# Patient Record
Sex: Female | Born: 1995 | Race: White | Hispanic: No | Marital: Single | Smoking: Never smoker
Health system: Southern US, Community
[De-identification: ages and names within clinical notes are randomized; demographics above are authoritative.]

## PROBLEM LIST (undated history)

## (undated) DIAGNOSIS — N83201 Unspecified ovarian cyst, right side: Secondary | ICD-10-CM

## (undated) HISTORY — DX: Unspecified ovarian cyst, right side: N83.201

---
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
## 2016-06-29 ENCOUNTER — Emergency Department (HOSPITAL_COMMUNITY): Payer: Commercial Managed Care - PPO

## 2016-06-29 ENCOUNTER — Encounter (HOSPITAL_COMMUNITY): Payer: Self-pay | Admitting: *Deleted

## 2016-06-29 ENCOUNTER — Emergency Department (HOSPITAL_COMMUNITY)
Admission: EM | Admit: 2016-06-29 | Discharge: 2016-06-29 | Disposition: A | Discharge status: Home / Self Care | Payer: Commercial Managed Care - PPO | Attending: Emergency Medicine | Admitting: Emergency Medicine

## 2016-06-29 DIAGNOSIS — R1031 Right lower quadrant pain: Secondary | ICD-10-CM | POA: Diagnosis present

## 2016-06-29 DIAGNOSIS — N83201 Unspecified ovarian cyst, right side: Secondary | ICD-10-CM | POA: Diagnosis not present

## 2016-06-29 LAB — COMPREHENSIVE METABOLIC PANEL
ALBUMIN: 4.4 g/dL (ref 3.5–5.0)
ALT: 16 U/L (ref 14–54)
AST: 19 U/L (ref 15–41)
Alkaline Phosphatase: 80 U/L (ref 38–126)
Anion gap: 7 (ref 5–15)
BILIRUBIN TOTAL: 0.5 mg/dL (ref 0.3–1.2)
BUN: 16 mg/dL (ref 6–20)
CHLORIDE: 105 mmol/L (ref 101–111)
CO2: 28 mmol/L (ref 22–32)
CREATININE: 0.72 mg/dL (ref 0.44–1.00)
Calcium: 9.7 mg/dL (ref 8.9–10.3)
GFR calc Af Amer: >60 mL/min (ref >60)
GFR calc non Af Amer: >60 mL/min (ref >60)
Glucose, Bld: 93 mg/dL (ref 65–99)
Potassium: 4.3 mmol/L (ref 3.5–5.1)
Sodium: 140 mmol/L (ref 135–145)
Total Protein: 7.8 g/dL (ref 6.5–8.1)

## 2016-06-29 LAB — LIPASE, BLOOD: Lipase: 26 U/L (ref 11–51)

## 2016-06-29 LAB — URINALYSIS, ROUTINE W REFLEX MICROSCOPIC
BILIRUBIN URINE: NEGATIVE
GLUCOSE, UA: NEGATIVE mg/dL
HGB URINE DIPSTICK: NEGATIVE
Ketones, ur: NEGATIVE mg/dL
Leukocytes, UA: NEGATIVE
Nitrite: NEGATIVE
PH: 7 (ref 5.0–8.0)
Protein, ur: NEGATIVE mg/dL
Specific Gravity, Urine: 1.008 (ref 1.005–1.030)

## 2016-06-29 LAB — CBC
HEMATOCRIT: 42.8 % (ref 36.0–46.0)
Hemoglobin: 14.1 g/dL (ref 12.0–15.0)
MCH: 29.1 pg (ref 26.0–34.0)
MCHC: 32.9 g/dL (ref 30.0–36.0)
MCV: 88.2 fL (ref 78.0–100.0)
Platelets: 239 10*3/uL (ref 150–400)
RBC: 4.85 MIL/uL (ref 3.87–5.11)
RDW: 12.8 % (ref 11.5–15.5)
WBC: 7.7 10*3/uL (ref 4.0–10.5)

## 2016-06-29 LAB — WET PREP, GENITAL
Clue Cells Wet Prep HPF POC: NONE SEEN
SPERM: NONE SEEN
Trich, Wet Prep: NONE SEEN
Yeast Wet Prep HPF POC: NONE SEEN

## 2016-06-29 LAB — POC URINE PREG, ED: Preg Test, Ur: NEGATIVE

## 2016-06-29 MED ORDER — KETOROLAC TROMETHAMINE 30 MG/ML IJ SOLN
30.0000 mg | Freq: Once | INTRAMUSCULAR | Status: AC
  Administered 2016-06-29: 30 mg via INTRAMUSCULAR
  Filled 2016-06-29: qty 1

## 2016-06-29 MED ORDER — NAPROXEN SODIUM 550 MG PO TABS: 550.0000 mg | ORAL_TABLET | Freq: Two times a day (BID) | ORAL | 0 refills | Status: DC

## 2016-06-29 NOTE — ED Notes (Signed)
Pt verbalized understanding discharge instructions and denies any further needs or questions at this time. VS stable, ambulatory and steady gait.   

## 2016-06-29 NOTE — ED Provider Notes (Signed)
[facility]-EMERGENCY DEPT Provider Note   CSN: [id number] Arrival date & time: 06/29/16  1620  By signing my name below, I, Rosario Adie, attest that this documentation has been prepared under the direction and in the presence of [facility], [place].  Electronically Signed: Rosario Adie, ED Scribe. 06/29/16. 5:06 PM.  History   Chief Complaint Chief Complaint  Patient presents with  . Flank Pain   The history is provided by the patient. No language interpreter was used.  Flank Pain  This is a new problem. The current episode started yesterday. The problem occurs constantly. The problem has been gradually worsening. Nothing aggravates the symptoms. Nothing relieves the symptoms. She has tried ASA and acetaminophen for the symptoms. The treatment provided no relief.    HPI Comments: Kelsey Patel is a 21 y.o. female who presents to the Emergency Department complaining of persistent, aching right flank pain beginning yesterday. She notes radiation of her pain into her right sided abdominal region. Pt has a h/o similar pain ~6-7 years ago related to an ovarian cyst confirmed via CT scan. No medical interventions were tried for this issue and her pain resolved on its own at that time. She has been taking Asprin and Tylenol at home without relief of her pain. Pt has not previously been pregnant or had a prior STI. She is not currently on BCP nor is she sexually active. Pt denies nausea, vomiting, diarrhea, fever, chills, frequency, urgency, hematuria, or any other associated symptoms. LNMP: ~2 weeks ago.   PCP: none  History reviewed. No pertinent past medical history.  There are no active problems to display for this patient.  History reviewed. No pertinent surgical history.  OB History    No data available     Home Medications    Prior to Admission medications   Medication Sig Start Date End Date Taking? Authorizing Provider  naproxen sodium (ANAPROX DS) 550 MG tablet Take 1  tablet (550 mg total) by mouth 2 (two) times daily with a meal. 06/29/16   Hope Orlene Och, NP   Family History No family history on file.  Social History Social History  Substance Use Topics  . Smoking status: Never Smoker  . Smokeless tobacco: Never Used  . Alcohol use No   Allergies   Patient has no known allergies.  Review of Systems Review of Systems  Constitutional: Negative for chills and fever.  Gastrointestinal: Negative for diarrhea, nausea and vomiting.  Genitourinary: Positive for flank pain. Negative for dysuria, frequency, hematuria and urgency.       Pain radiates to the right side of abdomen   Skin: Negative for wound.  All other systems reviewed and are negative.  Physical Exam Updated Vital Signs BP 135/80 (BP Location: Right Arm)   Pulse 80   Temp 99.3 F (37.4 C)   Resp 17   Ht 5\' 7"  (1.702 m)   Wt 85.5 kg   LMP 06/15/2016   SpO2 100%   BMI 29.52 kg/m   Physical Exam  Constitutional: She appears well-developed and well-nourished. No distress.  HENT:  Head: Normocephalic and atraumatic.  Right Ear: Tympanic membrane normal.  Left Ear: Tympanic membrane normal.  Mouth/Throat: Uvula is midline, oropharynx is clear and moist and mucous membranes are normal. No posterior oropharyngeal edema or posterior oropharyngeal erythema.  Eyes: EOM are normal. Pupils are equal, round, and reactive to light.  Neck: Normal range of motion.  Cardiovascular: Normal rate and regular rhythm.   Pulmonary/Chest: Effort  normal and breath sounds normal.  Abdominal: Soft. Bowel sounds are normal. There is tenderness (mild tenderness in the right upper and right lower quadrant). There is no rebound, no guarding and no CVA tenderness.  Genitourinary:  Genitourinary Comments: Chaperone present throughout entire exam. External genitalia without lesions. White d/c vaginal vault, no CMT, right adnexal tenderness that is mild, no mass palpated, uterus without palpable enlargement.    Musculoskeletal: Normal range of motion.  Neurological: She is alert.  Skin: Skin is warm and dry.  Psychiatric: She has a normal mood and affect. Her behavior is normal.  Nursing note and vitals reviewed.  ED Treatments / Results  DIAGNOSTIC STUDIES: Oxygen Saturation is 100% on RA, normal by my interpretation.   COORDINATION OF CARE: 5:06 PM-Discussed next steps with pt. Pt verbalized understanding and is agreeable with the plan.   Labs (all labs ordered are listed, but only abnormal results are displayed) Labs Reviewed  WET PREP, GENITAL - Abnormal; Notable for the following:       Result Value   WBC, Wet Prep HPF POC MANY (*)    All other components within normal limits  URINALYSIS, ROUTINE W REFLEX MICROSCOPIC - Abnormal; Notable for the following:    Color, Urine STRAW (*)    All other components within normal limits  LIPASE, BLOOD  COMPREHENSIVE METABOLIC PANEL  CBC  POC URINE PREG, ED  GC/CHLAMYDIA PROBE AMP ([place]) NOT AT [facility]  Radiology [place] Pelvis Complete  Result Date: 06/29/2016 CLINICAL DATA:  21 y/o  F; 1 day of right lower quadrant pain. EXAM: TRANSABDOMINAL ULTRASOUND OF PELVIS DOPPLER ULTRASOUND OF OVARIES TECHNIQUE: Transabdominal ultrasound examination of the pelvis was performed including evaluation of the uterus, ovaries, adnexal regions, and pelvic cul-de-sac. Color and duplex Doppler ultrasound was utilized to evaluate blood flow to the ovaries. COMPARISON:  06/14/2009 pelvic ultrasound. FINDINGS: Uterus Measurements: 7.2 x 2.3 x 4.7 cm. No fibroids or other mass visualized. Endometrium Thickness: 3.5 mm. No focal abnormality visualized. Right ovary Measurements: 8.5 x 5.3 x 5.8 cm. Cyst with simple features measuring 5.2 x 4.5 x 4.8 cm. Left ovary Measurements: 3.1 x 2.0 x 2.6 cm. Normal appearance/no adnexal mass. Pulsed Doppler evaluation demonstrates normal low-resistance arterial and venous waveforms in both ovaries. IMPRESSION: Right ovarian cysts  measuring up to 5.2 cm with benign features. A right ovarian cyst was present on the prior study measuring 5.8 cm. This is almost certainly benign, but follow up ultrasound is recommended in 1 year according to the Society of Radiologists in Ultrasound2010 Consensus Conference Statement (D Lenis NoonLevine et al. Management of Asymptomatic Ovarian and Other Adnexal Cysts Imaged at US: Society of Radiologists in Ultrasound Consensus Conference Statement 2010. Radiology 256 (Sept 2010): 943-954.). Electronically Signed   By: Mitzi HansenLance  Furusawa-Stratton M.D.   On: 06/29/2016 20:02   [place] Abdomen Limited  Result Date: 06/29/2016 CLINICAL DATA:  Abdominal pain EXAM: LIMITED ABDOMINAL ULTRASOUND TECHNIQUE: Wallace CullensGray scale imaging of the right lower quadrant was performed to evaluate for suspected appendicitis. Standard imaging planes and graded compression technique were utilized. COMPARISON:  None. FINDINGS: The appendix is not visualized. Ancillary findings: None. Factors affecting image quality: Body habitus IMPRESSION: The appendix is nonvisualized. No specific abnormalities are visualized in the right lower quadrant. Note: Non-visualization of appendix by US does not definitely exclude appendicitis. If there is sufficient clinical concern, consider abdomen pelvis CT with contrast for further evaluation. Electronically Signed   By: Jasmine PangKim  Fujinaga M.D.   On: 06/29/2016 19:57  [place] Art/ven Flow Abd Pelv Doppler  Result Date: 06/29/2016 CLINICAL DATA:  21 y/o  F; 1 day of right lower quadrant pain. EXAM: TRANSABDOMINAL ULTRASOUND OF PELVIS DOPPLER ULTRASOUND OF OVARIES TECHNIQUE: Transabdominal ultrasound examination of the pelvis was performed including evaluation of the uterus, ovaries, adnexal regions, and pelvic cul-de-sac. Color and duplex Doppler ultrasound was utilized to evaluate blood flow to the ovaries. COMPARISON:  06/14/2009 pelvic ultrasound. FINDINGS: Uterus Measurements: 7.2 x 2.3 x 4.7 cm. No fibroids or other mass  visualized. Endometrium Thickness: 3.5 mm. No focal abnormality visualized. Right ovary Measurements: 8.5 x 5.3 x 5.8 cm. Cyst with simple features measuring 5.2 x 4.5 x 4.8 cm. Left ovary Measurements: 3.1 x 2.0 x 2.6 cm. Normal appearance/no adnexal mass. Pulsed Doppler evaluation demonstrates normal low-resistance arterial and venous waveforms in both ovaries. IMPRESSION: Right ovarian cysts measuring up to 5.2 cm with benign features. A right ovarian cyst was present on the prior study measuring 5.8 cm. This is almost certainly benign, but follow up ultrasound is recommended in 1 year according to the Society of Radiologists in Ultrasound2010 Consensus Conference Statement (D Lenis Noon et al. Management of Asymptomatic Ovarian and Other Adnexal Cysts Imaged at [place]: Society of Radiologists in Ultrasound Consensus Conference Statement 2010. Radiology 256 (Sept 2010): 943-954.). Electronically Signed   By: Mitzi Hansen M.D.   On: 06/29/2016 20:02    Procedures Procedures   Medications Ordered in ED Medications  ketorolac (TORADOL) 30 MG/ML injection 30 mg (30 mg Intramuscular Given 06/29/16 2045)    Initial Impression / Assessment and Plan / ED Course  I have reviewed the triage vital signs and the nursing notes.  Pertinent labs & imaging results that were available during my care of the patient were reviewed by me and considered in my medical decision making (see chart for details).  Final Clinical Impressions(s) / ED Diagnoses  21 y.o. female with right side pain stable for d/c without acute abdomen. Normal labs, ultrasound shows a right ovarian cyst. Patient instructed to f/u with GYN. Will treat for pain. Return precautions given. Toradol 30 mg. IM prior to d/c.   Final diagnoses:  RLQ abdominal pain  Ovarian cyst, right   New Prescriptions Discharge Medication List as of 06/29/2016  8:35 PM    START taking these medications   Details  naproxen sodium (ANAPROX DS) 550 MG tablet  Take 1 tablet (550 mg total) by mouth 2 (two) times daily with a meal., Starting Wed 06/29/2016, Print       I personally performed the services described in this documentation, which was scribed in my presence. The recorded information has been reviewed and is accurate.     [address] [place], [place] 06/30/16 0128    Lyndal Pulley, MD 06/30/16 [phone]

## 2016-06-29 NOTE — ED Triage Notes (Signed)
The pt is c/o lt flank and lower abd pain since yesterday  No n v or diarrhea   She was sent here from an urgent care in [place]  lmp 2 weeks ago

## 2016-06-29 NOTE — Discharge Instructions (Signed)
Your ultrasound shows that you have a 5 cm cyst on your right ovary. Follow up with your primary care doctor or make an appointment in the [facility] for follow up. Take the medication as needed for pain.

## 2016-06-30 LAB — GC/CHLAMYDIA PROBE AMP (~~LOC~~) NOT AT ARMC
CHLAMYDIA, DNA PROBE: NEGATIVE
NEISSERIA GONORRHEA: NEGATIVE

## 2016-07-01 ENCOUNTER — Encounter (HOSPITAL_COMMUNITY): Payer: Self-pay | Admitting: Emergency Medicine

## 2016-07-01 ENCOUNTER — Emergency Department (HOSPITAL_COMMUNITY)
Admission: EM | Admit: 2016-07-01 | Discharge: 2016-07-01 | Disposition: A | Discharge status: Home / Self Care | Payer: Commercial Managed Care - PPO | Attending: Emergency Medicine | Admitting: Emergency Medicine

## 2016-07-01 ENCOUNTER — Emergency Department (HOSPITAL_COMMUNITY): Payer: Commercial Managed Care - PPO

## 2016-07-01 DIAGNOSIS — N83201 Unspecified ovarian cyst, right side: Secondary | ICD-10-CM | POA: Insufficient documentation

## 2016-07-01 DIAGNOSIS — R1031 Right lower quadrant pain: Secondary | ICD-10-CM | POA: Diagnosis present

## 2016-07-01 LAB — COMPREHENSIVE METABOLIC PANEL
ALBUMIN: 4.1 g/dL (ref 3.5–5.0)
ALK PHOS: 73 U/L (ref 38–126)
ALT: 20 U/L (ref 14–54)
AST: 23 U/L (ref 15–41)
Anion gap: 11 (ref 5–15)
BILIRUBIN TOTAL: 1 mg/dL (ref 0.3–1.2)
BUN: 13 mg/dL (ref 6–20)
CALCIUM: 9.4 mg/dL (ref 8.9–10.3)
CO2: 23 mmol/L (ref 22–32)
CREATININE: 0.81 mg/dL (ref 0.44–1.00)
Chloride: 98 mmol/L — ABNORMAL LOW (ref 101–111)
GFR calc Af Amer: >60 mL/min (ref >60)
GLUCOSE: 119 mg/dL — AB (ref 65–99)
Potassium: 3.9 mmol/L (ref 3.5–5.1)
Sodium: 132 mmol/L — ABNORMAL LOW (ref 135–145)
TOTAL PROTEIN: 7.5 g/dL (ref 6.5–8.1)

## 2016-07-01 LAB — CBC
HCT: 43.1 % (ref 36.0–46.0)
Hemoglobin: 14.3 g/dL (ref 12.0–15.0)
MCH: 28.7 pg (ref 26.0–34.0)
MCHC: 33.2 g/dL (ref 30.0–36.0)
MCV: 86.4 fL (ref 78.0–100.0)
PLATELETS: 241 10*3/uL (ref 150–400)
RBC: 4.99 MIL/uL (ref 3.87–5.11)
RDW: 12.3 % (ref 11.5–15.5)
WBC: 6.6 10*3/uL (ref 4.0–10.5)

## 2016-07-01 LAB — LIPASE, BLOOD: Lipase: 21 U/L (ref 11–51)

## 2016-07-01 LAB — I-STAT BETA HCG BLOOD, ED (MC, WL, AP ONLY): HCG, QUANTITATIVE: 5.4 m[IU]/mL — AB (ref <5)

## 2016-07-01 MED ORDER — KETOROLAC TROMETHAMINE 30 MG/ML IJ SOLN
30.0000 mg | Freq: Once | INTRAMUSCULAR | Status: AC
  Administered 2016-07-01: 30 mg via INTRAVENOUS
  Filled 2016-07-01: qty 1

## 2016-07-01 MED ORDER — ONDANSETRON 4 MG PO TBDP
4.0000 mg | ORAL_TABLET | Freq: Once | ORAL | Status: AC | PRN
  Administered 2016-07-01: 4 mg via ORAL

## 2016-07-01 MED ORDER — ONDANSETRON HCL 4 MG/2ML IJ SOLN
4.0000 mg | Freq: Once | INTRAMUSCULAR | Status: AC
  Administered 2016-07-01: 4 mg via INTRAVENOUS
  Filled 2016-07-01: qty 2

## 2016-07-01 MED ORDER — OXYCODONE-ACETAMINOPHEN 5-325 MG PO TABS
2.0000 | ORAL_TABLET | Freq: Once | ORAL | Status: AC
  Administered 2016-07-01: 1 via ORAL
  Filled 2016-07-01: qty 2

## 2016-07-01 MED ORDER — HYDROMORPHONE HCL 2 MG/ML IJ SOLN
1.0000 mg | Freq: Once | INTRAMUSCULAR | Status: AC
  Administered 2016-07-01: 1 mg via INTRAVENOUS
  Filled 2016-07-01: qty 1

## 2016-07-01 MED ORDER — SODIUM CHLORIDE 0.9 % IV BOLUS (SEPSIS)
1000.0000 mL | Freq: Once | INTRAVENOUS | Status: AC
  Administered 2016-07-01: 1000 mL via INTRAVENOUS

## 2016-07-01 MED ORDER — OXYCODONE-ACETAMINOPHEN 5-325 MG PO TABS: 1.0000 | ORAL_TABLET | ORAL | 0 refills | Status: DC | PRN

## 2016-07-01 MED ORDER — ONDANSETRON HCL 4 MG PO TABS: 4.0000 mg | ORAL_TABLET | Freq: Four times a day (QID) | ORAL | 0 refills | Status: DC

## 2016-07-01 MED ORDER — IOPAMIDOL (ISOVUE-300) INJECTION 61%
INTRAVENOUS | Status: AC
  Administered 2016-07-01: 100 mL
  Filled 2016-07-01: qty 100

## 2016-07-01 MED ORDER — ONDANSETRON 4 MG PO TBDP
ORAL_TABLET | ORAL | Status: AC
  Filled 2016-07-01: qty 1

## 2016-07-01 NOTE — ED Provider Notes (Signed)
[facility]-EMERGENCY DEPT Provider Note   CSN: [id number] Arrival date & time: 07/01/16  0110  By signing my name below, I, Cynda AcresHailei Fulton, attest that this documentation has been prepared under the direction and in the presence of Gilda Creasehristopher J Frank Pilger, MD. Electronically Signed: Cynda AcresHailei Fulton, Scribe. 07/01/16. 2:27 AM.  History   Chief Complaint Chief Complaint  Patient presents with  . Abdominal Pain    RLQ    HPI Comments: Kelsey Patel is a 21 y.o. female with no pertinent medical history, who presents to the Emergency Department complaining of sudden-onset, gradually worsening RLQ abdominal pain that began a few days ago. Patient states her pain has been constant. Patient was here 2 days ago, in which an ultrasound was done revealing a right ovarian cyst, she was prescribed naproxen. Patient was advised to return to the emergency department if her symptoms worsened. Father states the last time the patient had this problem was 8 years ago. No pain relief with prescribed pain medication. Patient denies any shortness of breath, nausea, vomiting, diarrhea, or fever.   The history is provided by the patient.    History reviewed. No pertinent past medical history.  There are no active problems to display for this patient.   History reviewed. No pertinent surgical history.  OB History    No data available       Home Medications    Prior to Admission medications   Medication Sig Start Date End Date Taking? Authorizing Provider  naproxen sodium (ANAPROX DS) 550 MG tablet Take 1 tablet (550 mg total) by mouth 2 (two) times daily with a meal. 06/29/16  Yes Hope Orlene OchM Neese, NP  ondansetron (ZOFRAN) 4 MG tablet Take 1 tablet (4 mg total) by mouth every 6 (six) hours. 07/01/16   Gilda Creasehristopher J Burlie Cajamarca, MD  oxyCODONE-acetaminophen (PERCOCET) 5-325 MG tablet Take 1-2 tablets by mouth every 4 (four) hours as needed for severe pain. 07/01/16   Gilda Creasehristopher J Oddie Bottger, MD    Family History History  reviewed. No pertinent family history.  Social History Social History  Substance Use Topics  . Smoking status: Never Smoker  . Smokeless tobacco: Never Used  . Alcohol use No     Allergies   Patient has no known allergies.   Review of Systems Review of Systems  Constitutional: Negative for fever.  Respiratory: Negative for shortness of breath.   Gastrointestinal: Positive for abdominal pain. Negative for diarrhea, nausea and vomiting.  All other systems reviewed and are negative.    Physical Exam Updated Vital Signs BP 123/72   Pulse 82   Temp 97.8 F (36.6 C) (Oral)   Resp 16   LMP 06/15/2016   SpO2 98%   Physical Exam  Constitutional: She is oriented to person, place, and time. She appears well-developed and well-nourished. No distress.  HENT:  Head: Normocephalic and atraumatic.  Right Ear: Hearing normal.  Left Ear: Hearing normal.  Nose: Nose normal.  Mouth/Throat: Oropharynx is clear and moist and mucous membranes are normal.  Eyes: Conjunctivae and EOM are normal. Pupils are equal, round, and reactive to light.  Neck: Normal range of motion. Neck supple.  Cardiovascular: Regular rhythm, S1 normal and S2 normal.  Exam reveals no gallop and no friction rub.   No murmur heard. Pulmonary/Chest: Effort normal and breath sounds normal. No respiratory distress. She exhibits no tenderness.  Abdominal: Soft. Normal appearance and bowel sounds are normal. There is no hepatosplenomegaly. There is tenderness. There is no rebound, no guarding, no  tenderness at McBurney's point and negative Murphy's sign.  Diffuse right sided abdominal tenderness.   Musculoskeletal: Normal range of motion.  Neurological: She is alert and oriented to person, place, and time. She has normal strength. No cranial nerve deficit or sensory deficit. Coordination normal. GCS eye subscore is 4. GCS verbal subscore is 5. GCS motor subscore is 6.  Skin: Skin is warm, dry and intact. No rash noted. No  cyanosis.  Psychiatric: She has a normal mood and affect. Her speech is normal and behavior is normal. Thought content normal.  Nursing note and vitals reviewed.    ED Treatments / Results  DIAGNOSTIC STUDIES: Oxygen Saturation is 100% on RA, normal by my interpretation.    COORDINATION OF CARE: 2:27 AM Discussed treatment plan with pt at bedside and pt agreed to plan.  Labs (all labs ordered are listed, but only abnormal results are displayed) Labs Reviewed  COMPREHENSIVE METABOLIC PANEL - Abnormal; Notable for the following:       Result Value   Sodium 132 (*)    Chloride 98 (*)    Glucose, Bld 119 (*)    All other components within normal limits  I-STAT BETA HCG BLOOD, ED ([facility], [facility], [facility] ONLY) - Abnormal; Notable for the following:    I-stat hCG, quantitative 5.4 (*)    All other components within normal limits  LIPASE, BLOOD  CBC    EKG  EKG Interpretation None       Radiology US Pelvis Complete  Result Date: 06/29/2016 CLINICAL DATA:  21 y/o  F; 1 day of right lower quadrant pain. EXAM: TRANSABDOMINAL ULTRASOUND OF PELVIS DOPPLER ULTRASOUND OF OVARIES TECHNIQUE: Transabdominal ultrasound examination of the pelvis was performed including evaluation of the uterus, ovaries, adnexal regions, and pelvic cul-de-sac. Color and duplex Doppler ultrasound was utilized to evaluate blood flow to the ovaries. COMPARISON:  06/14/2009 pelvic ultrasound. FINDINGS: Uterus Measurements: 7.2 x 2.3 x 4.7 cm. No fibroids or other mass visualized. Endometrium Thickness: 3.5 mm. No focal abnormality visualized. Right ovary Measurements: 8.5 x 5.3 x 5.8 cm. Cyst with simple features measuring 5.2 x 4.5 x 4.8 cm. Left ovary Measurements: 3.1 x 2.0 x 2.6 cm. Normal appearance/no adnexal mass. Pulsed Doppler evaluation demonstrates normal low-resistance arterial and venous waveforms in both ovaries. IMPRESSION: Right ovarian cysts measuring up to 5.2 cm with benign features. A right ovarian cyst was  present on the prior study measuring 5.8 cm. This is almost certainly benign, but follow up ultrasound is recommended in 1 year according to the Society of Radiologists in Ultrasound2010 Consensus Conference Statement (D Lenis Noon et al. Management of Asymptomatic Ovarian and Other Adnexal Cysts Imaged at [place]: Society of Radiologists in Ultrasound Consensus Conference Statement 2010. Radiology 256 (Sept 2010): 943-954.). Electronically Signed   By: Mitzi Hansen M.D.   On: 06/29/2016 20:02   Ct Abdomen Pelvis W Contrast  Result Date: 07/01/2016 CLINICAL DATA:  Initial evaluation for acute severe right lower quadrant pain. Evaluate for possible appendicitis. EXAM: CT ABDOMEN AND PELVIS WITH CONTRAST TECHNIQUE: Multidetector CT imaging of the abdomen and pelvis was performed using the standard protocol following bolus administration of intravenous contrast. CONTRAST:  ISOVUE-300 IOPAMIDOL (ISOVUE-300) INJECTION 61% COMPARISON:  Prior pelvic ultrasound from 06/29/2016. FINDINGS: Lower chest: Visualized lung bases are clear. Hepatobiliary: The liver demonstrates a normal contrast enhanced appearance. Gallbladder within normal limits. No biliary dilatation. Pancreas: Pancreas within normal limits. Spleen: Spleen within normal limits. Adrenals/Urinary Tract: Adrenal glands are normal. Kidneys equal size with  symmetric enhancement. No nephrolithiasis, hydronephrosis, or focal enhancing renal mass. No hydroureter. Bladder within normal limits. Stomach/Bowel: Stomach within normal limits. No evidence for bowel obstruction. Appendix well visualized within the right lower quadrant, retrocecal in location. Appendix within normal limits for caliber without associated inflammatory changes to suggest acute appendicitis. No abnormal wall thickening, mucosal enhancement, or inflammatory fat stranding seen about the bowels. Vascular/Lymphatic: Normal intravascular enhancement seen throughout the intra-abdominal aorta  and its branch vessels. No pathologically enlarged intra-abdominal or pelvic lymph nodes identified. Reproductive: The uterus within normal limits. Left ovary unremarkable. Large cysts measuring approximately 5.6 x 5.7 x 5.7 cm seen within the right pelvic cul-de-sac, likely similar to previous this is largely simple in appearance, with near. The adjacent right ovary is not well delineated. Small volume free fluid within the adjacent pelvic cul-de-sac. Other: No free intraperitoneal air. Musculoskeletal: No acute osseous abnormality. No worrisome lytic or blastic osseous lesions. IMPRESSION: 1. Normal appendix.  No findings to suggest acute appendicitis. 2. Prominent right adnexal cyst measuring up to 5.7 cm on today's study, likely similar as compared to recent ultrasound from 06/29/2016. Small volume free fluid is evident within the pelvic cul-de-sac, which may reflect interval cyst rupture, as no fluid was visualized on prior ultrasound. If ovarian torsion is a concern, a repeat Doppler ultrasound is recommended. Otherwise, follow-up recommendations as detailed on prior exam. 3. No other acute intra-abdominal or pelvic process identified. Electronically Signed   By: Rise Mu M.D.   On: 07/01/2016 02:19   US Abdomen Limited  Result Date: 06/29/2016 CLINICAL DATA:  Abdominal pain EXAM: LIMITED ABDOMINAL ULTRASOUND TECHNIQUE: Wallace Cullens scale imaging of the right lower quadrant was performed to evaluate for suspected appendicitis. Standard imaging planes and graded compression technique were utilized. COMPARISON:  None. FINDINGS: The appendix is not visualized. Ancillary findings: None. Factors affecting image quality: Body habitus IMPRESSION: The appendix is nonvisualized. No specific abnormalities are visualized in the right lower quadrant. Note: Non-visualization of appendix by [place] does not definitely exclude appendicitis. If there is sufficient clinical concern, consider abdomen pelvis CT with contrast  for further evaluation. Electronically Signed   By: Jasmine Pang M.D.   On: 06/29/2016 19:57   [place] Art/ven Flow Abd Pelv Doppler  Result Date: 06/29/2016 CLINICAL DATA:  21 y/o  F; 1 day of right lower quadrant pain. EXAM: TRANSABDOMINAL ULTRASOUND OF PELVIS DOPPLER ULTRASOUND OF OVARIES TECHNIQUE: Transabdominal ultrasound examination of the pelvis was performed including evaluation of the uterus, ovaries, adnexal regions, and pelvic cul-de-sac. Color and duplex Doppler ultrasound was utilized to evaluate blood flow to the ovaries. COMPARISON:  06/14/2009 pelvic ultrasound. FINDINGS: Uterus Measurements: 7.2 x 2.3 x 4.7 cm. No fibroids or other mass visualized. Endometrium Thickness: 3.5 mm. No focal abnormality visualized. Right ovary Measurements: 8.5 x 5.3 x 5.8 cm. Cyst with simple features measuring 5.2 x 4.5 x 4.8 cm. Left ovary Measurements: 3.1 x 2.0 x 2.6 cm. Normal appearance/no adnexal mass. Pulsed Doppler evaluation demonstrates normal low-resistance arterial and venous waveforms in both ovaries. IMPRESSION: Right ovarian cysts measuring up to 5.2 cm with benign features. A right ovarian cyst was present on the prior study measuring 5.8 cm. This is almost certainly benign, but follow up ultrasound is recommended in 1 year according to the Society of Radiologists in Ultrasound2010 Consensus Conference Statement (D Lenis Noon et al. Management of Asymptomatic Ovarian and Other Adnexal Cysts Imaged at [place]: Society of Radiologists in Ultrasound Consensus Conference Statement 2010. Radiology 256 (Sept 2010): 943-954.). Electronically Signed  By: Mitzi Hansen M.D.   On: 06/29/2016 20:02    Procedures Procedures (including critical care time)  Medications Ordered in ED Medications  ondansetron (ZOFRAN-ODT) 4 MG disintegrating tablet (not administered)  oxyCODONE-acetaminophen (PERCOCET/ROXICET) 5-325 MG per tablet 2 tablet (not administered)  ondansetron (ZOFRAN-ODT) disintegrating tablet  4 mg (4 mg Oral Given 07/01/16 0122)  iopamidol (ISOVUE-300) 61 % injection (100 mLs  Contrast Given 07/01/16 0148)  sodium chloride 0.9 % bolus 1,000 mL (1,000 mLs Intravenous New Bag/Given 07/01/16 0257)  HYDROmorphone (DILAUDID) injection 1 mg (1 mg Intravenous Given 07/01/16 0236)  ondansetron (ZOFRAN) injection 4 mg (4 mg Intravenous Given 07/01/16 0236)  ketorolac (TORADOL) 30 MG/ML injection 30 mg (30 mg Intravenous Given 07/01/16 0415)     Initial Impression / Assessment and Plan / ED Course  I have reviewed the triage vital signs and the nursing notes.  Pertinent labs & imaging results that were available during my care of the patient were reviewed by me and considered in my medical decision making (see chart for details).     Patient presents to the emergency department for evaluation of persistent and worsening right lower quadrant pain. Patient was seen one day ago with similar symptoms. Pelvic ultrasound including Doppler revealed evidence of large adnexal cyst without complications. Patient has been taking the Naprosyn, but tonight the pain worsened. Patient was in a great deal of distress at arrival. Pain significantly improved with treatment here in the ER.  Plan CT scan to rule out other causes of pain, such as appendicitis. CT scan shows a normal appendix. There is some pelvic fluid, perhaps consistent with rupture of cyst. No evidence of hemorrhage or hemodynamic instability. Patient appropriate for discharge, increased analgesia and follow-up with gynecology.  Final Clinical Impressions(s) / ED Diagnoses   Final diagnoses:  Cyst of right ovary    New Prescriptions New Prescriptions   ONDANSETRON (ZOFRAN) 4 MG TABLET    Take 1 tablet (4 mg total) by mouth every 6 (six) hours.   OXYCODONE-ACETAMINOPHEN (PERCOCET) 5-325 MG TABLET    Take 1-2 tablets by mouth every 4 (four) hours as needed for severe pain.   I personally performed the services described in this documentation, which  was scribed in my presence. The recorded information has been reviewed and is accurate.     Gilda Crease, MD 07/01/16 [phone]

## 2016-07-01 NOTE — ED Triage Notes (Signed)
Pt presents with worsening RLQ abd pain; seen here for same couple days ago; pt was told to return for worsening symptoms- concerned for appendicitis

## 2016-08-08 ENCOUNTER — Encounter: Payer: Self-pay | Admitting: Obstetrics and Gynecology

## 2016-08-08 ENCOUNTER — Ambulatory Visit (INDEPENDENT_AMBULATORY_CARE_PROVIDER_SITE_OTHER): Payer: Commercial Managed Care - PPO | Admitting: Obstetrics and Gynecology

## 2016-08-08 DIAGNOSIS — N83201 Unspecified ovarian cyst, right side: Secondary | ICD-10-CM | POA: Diagnosis not present

## 2016-08-08 DIAGNOSIS — N946 Dysmenorrhea, unspecified: Secondary | ICD-10-CM | POA: Diagnosis not present

## 2016-08-08 DIAGNOSIS — N83209 Unspecified ovarian cyst, unspecified side: Secondary | ICD-10-CM | POA: Insufficient documentation

## 2016-08-08 MED ORDER — NORETHIN-ETH ESTRAD-FE BIPHAS 1 MG-10 MCG / 10 MCG PO TABS: 1.0000 | ORAL_TABLET | Freq: Every day | ORAL | 11 refills | Status: DC

## 2016-08-08 NOTE — Progress Notes (Signed)
[place] scheduled for April 20th @ 1400.  Pt notified.   Ms Quebedeaux is a 21 yo nulligravid female who presents to clinic for f/o of right ovarian cyst.  Pt seen in ER first part of March for pain and Dx with ovarian cyst. See ER visit for more information.  She reports that her pain has resolved.  She reports monthly cycles, cramps, last 6-7 days and some PMS Sx.  Pt not sexual active nor has she been.  PE AF VSS Lungs clear Heart RRR   A/P  Ovarian Cyst     Dysmenorrhea  Ovarian cysts reviewed with pt. Will schedule GYN U/S for f/u. Discussed OCP's for dysmenorrhea. Pt desired. U/R/B of OCP's reviewed. Will start with next cycle. F/U in 3 months of PRN

## 2016-08-08 NOTE — Patient Instructions (Signed)
Dysmenorrhea Menstrual cramps (dysmenorrhea) are caused by the muscles of the uterus tightening (contracting) during a menstrual period. For some women, this discomfort is merely bothersome. For others, dysmenorrhea can be severe enough to interfere with everyday activities for a few days each month. Primary dysmenorrhea is menstrual cramps that last a couple of days when you start having menstrual periods or soon after. This often begins after a teenager starts having her period. As a woman gets older or has a baby, the cramps will usually lessen or disappear. Secondary dysmenorrhea begins later in life, lasts longer, and the pain may be stronger than primary dysmenorrhea. The pain may start before the period and last a few days after the period. What are the causes? Dysmenorrhea is usually caused by an underlying problem, such as:  The tissue lining the uterus grows outside of the uterus in other areas of the body (endometriosis).  The endometrial tissue, which normally lines the uterus, is found in or grows into the muscular walls of the uterus (adenomyosis).  The pelvic blood vessels are engorged with blood just before the menstrual period (pelvic congestive syndrome).  Overgrowth of cells (polyps) in the lining of the uterus or cervix.  Falling down of the uterus (prolapse) because of loose or stretched ligaments.  Depression.  Bladder problems, infection, or inflammation.  Problems with the intestine, a tumor, or irritable bowel syndrome.  Cancer of the female organs or bladder.  A severely tipped uterus.  A very tight opening or closed cervix.  Noncancerous tumors of the uterus (fibroids).  Pelvic inflammatory disease (PID).  Pelvic scarring (adhesions) from a previous surgery.  Ovarian cyst.  An intrauterine device (IUD) used for birth control. What increases the risk? You may be at greater risk of dysmenorrhea if:  You are younger than age 30.  You started puberty  early.  You have irregular or heavy bleeding.  You have never given birth.  You have a family history of this problem.  You are a smoker. What are the signs or symptoms?  Cramping or throbbing pain in your lower abdomen.  Headaches.  Lower back pain.  Nausea or vomiting.  Diarrhea.  Sweating or dizziness.  Loose stools. How is this diagnosed? A diagnosis is based on your history, symptoms, physical exam, diagnostic tests, or procedures. Diagnostic tests or procedures may include:  Blood tests.  Ultrasonography.  An examination of the lining of the uterus (dilation and curettage, D&C).  An examination inside your abdomen or pelvis with a scope (laparoscopy).  X-rays.  CT scan.  MRI.  An examination inside the bladder with a scope (cystoscopy).  An examination inside the intestine or stomach with a scope (colonoscopy, gastroscopy). How is this treated? Treatment depends on the cause of the dysmenorrhea. Treatment may include:  Pain medicine prescribed by your health care provider.  Birth control pills or an IUD with progesterone hormone in it.  Hormone replacement therapy.  Nonsteroidal anti-inflammatory drugs (NSAIDs). These may help stop the production of prostaglandins.  Surgery to remove adhesions, endometriosis, ovarian cyst, or fibroids.  Removal of the uterus (hysterectomy).  Progesterone shots to stop the menstrual period.  Cutting the nerves on the sacrum that go to the female organs (presacral neurectomy).  Electric current to the sacral nerves (sacral nerve stimulation).  Antidepressant medicine.  Psychiatric therapy, counseling, or group therapy.  Exercise and physical therapy.  Meditation and yoga therapy.  Acupuncture. Follow these instructions at home:  Only take over-the-counter or prescription medicines as directed   by your health care provider.  Place a heating pad or hot water bottle on your lower back or abdomen. Do not  sleep with the heating pad.  Use aerobic exercises, walking, swimming, biking, and other exercises to help lessen the cramping.  Massage to the lower back or abdomen may help.  Stop smoking.  Avoid alcohol and caffeine. Contact a health care provider if:  Your pain does not get better with medicine.  You have pain with sexual intercourse.  Your pain increases and is not controlled with medicines.  You have abnormal vaginal bleeding with your period.  You develop nausea or vomiting with your period that is not controlled with medicine. Get help right away if: You pass out. This information is not intended to replace advice given to you by your health care provider. Make sure you discuss any questions you have with your health care provider. Document Released: 04/11/2005 Document Revised: 09/17/2015 Document Reviewed: 09/27/2012 Elsevier Interactive Patient Education  2017 [employer] Ovarian Cyst An ovarian cyst is a fluid-filled sac on an ovary. The ovaries are organs that make eggs in women. Most ovarian cysts go away on their own and are not cancerous (are benign). Some cysts need treatment. Follow these instructions at home:  Take over-the-counter and prescription medicines only as told by your doctor.  Do not drive or use heavy machinery while taking prescription pain medicine.  Get pelvic exams and Pap tests as often as told by your doctor.  Return to your normal activities as told by your doctor. Ask your doctor what activities are safe for you.  Do not use any products that contain nicotine or tobacco, such as cigarettes and e-cigarettes. If you need help quitting, ask your doctor.  Keep all follow-up visits as told by your doctor. This is important. Contact a doctor if:  Your periods are:  Late.  Irregular.  Painful.  Your periods stop.  You have pelvic pain that does not go away.  You have pressure on your bladder.  You have trouble making your  bladder empty when you pee (urinate).  You have pain during sex.  You have any of the following in your belly (abdomen):  A feeling of fullness.  Pressure.  Discomfort.  Pain that does not go away.  Swelling.  You feel sick most of the time.  You have trouble pooping (have constipation).  You are not as hungry as usual (you lose your appetite).  You get very bad acne.  You start to have more hair on your body and face.  You are gaining weight or losing weight without changing your exercise and eating habits.  You think you may be pregnant. Get help right away if:  You have belly pain that is very bad or gets worse.  You cannot eat or drink without throwing up (vomiting).  You suddenly get a fever.  Your period is a lot heavier than usual. This information is not intended to replace advice given to you by your health care provider. Make sure you discuss any questions you have with your health care provider. Document Released: 09/28/2007 Document Revised: 10/30/2015 Document Reviewed: 09/13/2015 Elsevier Interactive Patient Education  2017 [employer].

## 2016-08-12 ENCOUNTER — Ambulatory Visit (HOSPITAL_COMMUNITY)
Admission: RE | Admit: 2016-08-12 | Discharge: 2016-08-12 | Disposition: A | Discharge status: Home / Self Care | Payer: Commercial Managed Care - PPO | Source: Ambulatory Visit | Attending: Obstetrics and Gynecology | Admitting: Obstetrics and Gynecology

## 2016-08-12 DIAGNOSIS — N83201 Unspecified ovarian cyst, right side: Secondary | ICD-10-CM

## 2016-08-16 ENCOUNTER — Telehealth: Payer: Self-pay | Admitting: *Deleted

## 2016-08-16 DIAGNOSIS — N83201 Unspecified ovarian cyst, right side: Secondary | ICD-10-CM

## 2016-08-16 NOTE — Telephone Encounter (Signed)
Called pt and spoke with her father.  He stated that Linday is at work and will be home after 4pm. I stated that I will call back and there is no emergency. Per Dr. Alysia Penna pt needs to be informed that her recent US showed that cysts are still present on her ovaries and they appear benign. She should continue OCP's and have follow up US in 2 months. The [place] must be performed one week after her cycle (Proper timing is extremely important). Order for [place] has been placed.

## 2016-08-23 NOTE — Telephone Encounter (Signed)
Called patient home phone and her father answered stating the patient was not there. The father gave me the patient cell phone number [phone] and said that the patient can be reached at that number after 12 noon. I will attempt to call the patient back.

## 2016-08-23 NOTE — Telephone Encounter (Signed)
Called pt and informed her of [place] results and plan of care recommendations per Dr. Alysia Penna.  Pt was instructed to call office on [place] one of menstrual cycle in about 2 months. We will schedule the [place] to be performed 1 week after her cycle begins.  Pt voiced understanding and had no questions.

## 2016-08-24 ENCOUNTER — Telehealth: Payer: Self-pay | Admitting: General Practice

## 2016-08-24 NOTE — Telephone Encounter (Signed)
Patient is calling to request ultrasound results. States she misses our calls because she works during the day. States the best time to be reached is 03-1229 and after 4.

## 2016-12-06 ENCOUNTER — Ambulatory Visit (HOSPITAL_COMMUNITY)
Admission: RE | Admit: 2016-12-06 | Discharge: 2016-12-06 | Disposition: A | Discharge status: Home / Self Care | Payer: Commercial Managed Care - PPO | Source: Ambulatory Visit | Attending: Obstetrics and Gynecology | Admitting: Obstetrics and Gynecology

## 2016-12-06 DIAGNOSIS — N83201 Unspecified ovarian cyst, right side: Secondary | ICD-10-CM

## 2016-12-06 DIAGNOSIS — Z87898 Personal history of other specified conditions: Secondary | ICD-10-CM | POA: Insufficient documentation

## 2016-12-06 DIAGNOSIS — Z09 Encounter for follow-up examination after completed treatment for conditions other than malignant neoplasm: Secondary | ICD-10-CM | POA: Insufficient documentation

## 2016-12-14 ENCOUNTER — Telehealth: Payer: Self-pay | Admitting: General Practice

## 2016-12-14 NOTE — Telephone Encounter (Signed)
-----   Message from Hermina Staggers, MD sent at 12/12/2016  9:30 AM EDT ----- Please let pt know that the ovarian cyst has resolved. Thanks Casimiro Needle

## 2016-12-14 NOTE — Telephone Encounter (Signed)
Patient called and left message stating she missed a call from [place] and would like her ultrasound results.

## 2016-12-14 NOTE — Telephone Encounter (Signed)
Called patient, no answer- unable to leave message due to no voicemail set up.  

## 2016-12-16 NOTE — Telephone Encounter (Signed)
Patient called again, wants results of u/s.

## 2016-12-16 NOTE — Telephone Encounter (Signed)
Called patient with u/s results. Patient voiced understanding and had no further questions.

## 2017-06-09 ENCOUNTER — Other Ambulatory Visit: Payer: Self-pay | Admitting: Obstetrics and Gynecology

## 2017-06-09 ENCOUNTER — Other Ambulatory Visit: Payer: Self-pay | Admitting: Obstetrics

## 2017-06-09 DIAGNOSIS — N946 Dysmenorrhea, unspecified: Secondary | ICD-10-CM

## 2017-06-09 DIAGNOSIS — Z3041 Encounter for surveillance of contraceptive pills: Secondary | ICD-10-CM

## 2017-06-09 MED ORDER — NORETHIN ACE-ETH ESTRAD-FE 1-20 MG-MCG(24) PO TABS: 1.0000 | ORAL_TABLET | Freq: Every day | ORAL | 4 refills | Status: DC

## 2017-06-13 ENCOUNTER — Other Ambulatory Visit: Payer: Self-pay

## 2017-06-13 DIAGNOSIS — Z3041 Encounter for surveillance of contraceptive pills: Secondary | ICD-10-CM

## 2017-06-13 MED ORDER — NORETHIN ACE-ETH ESTRAD-FE 1-20 MG-MCG(24) PO TABS: 1.0000 | ORAL_TABLET | Freq: Every day | ORAL | 4 refills | Status: DC

## 2018-08-17 ENCOUNTER — Other Ambulatory Visit: Payer: Self-pay | Admitting: Obstetrics

## 2018-08-17 DIAGNOSIS — Z3041 Encounter for surveillance of contraceptive pills: Secondary | ICD-10-CM

## 2018-12-01 IMAGING — CT CT ABD-PELV W/ CM
2 of 4 series · 16 of 46 positions shown, 18 images · IV contrast (Omni 300)
Comparison: Prior pelvic ultrasound from 06/29/2016.

CLINICAL DATA: Initial evaluation for acute severe right lower
quadrant pain. Evaluate for possible appendicitis.

EXAM:
CT ABDOMEN AND PELVIS WITH CONTRAST
TECHNIQUE: Multidetector CT imaging of the abdomen and pelvis was performed
using the standard protocol following bolus administration of
intravenous contrast.
CONTRAST:  100mL 0U1TAT-2FF IOPAMIDOL (0U1TAT-2FF) INJECTION 61%

[Series 3: a/p w/ 5mm · axial · 0.55mm/px · z∈[+902,+1277]mm · 13 of 83 slices shown, 15 images]
[im 4/83  soft-tissue]
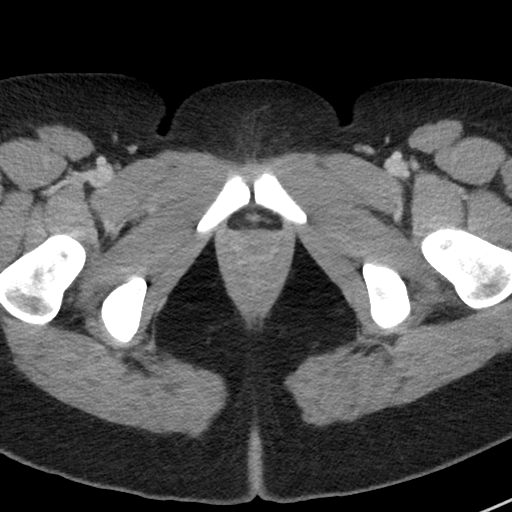
[im 4/83  bone]
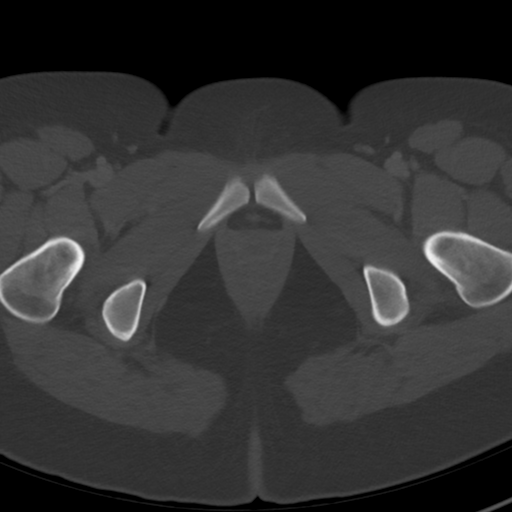
[im 10/83  soft-tissue]
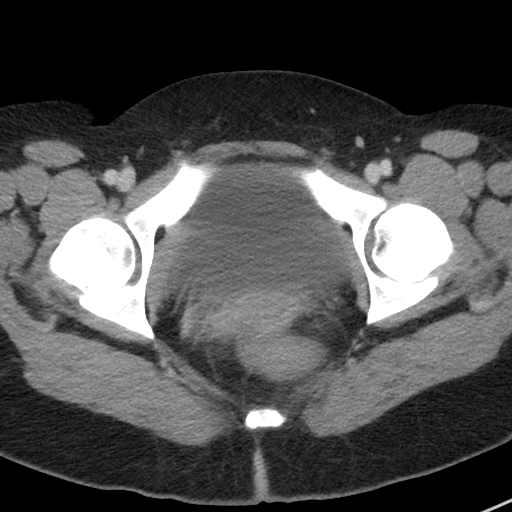
[im 16/83  soft-tissue]
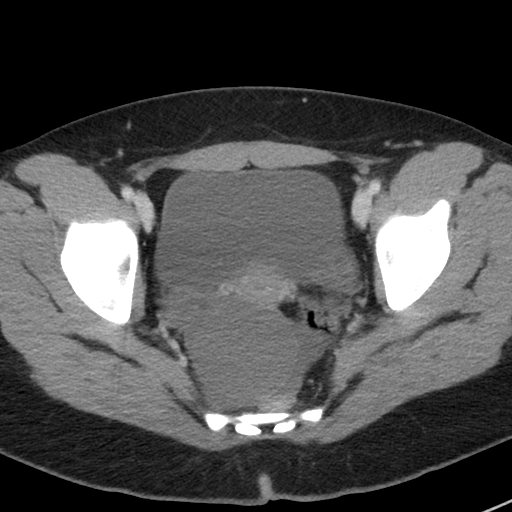
[im 23/83  soft-tissue]
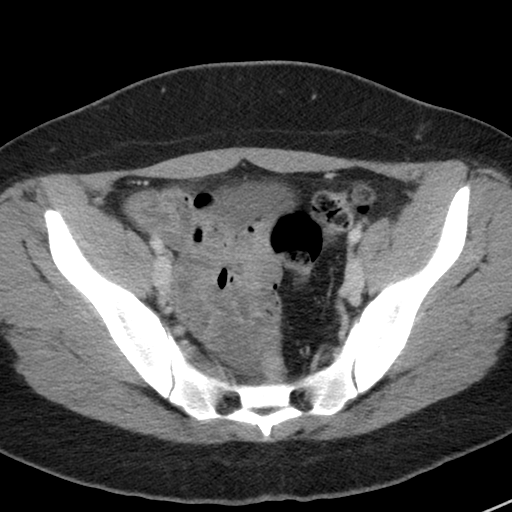
[im 29/83  soft-tissue]
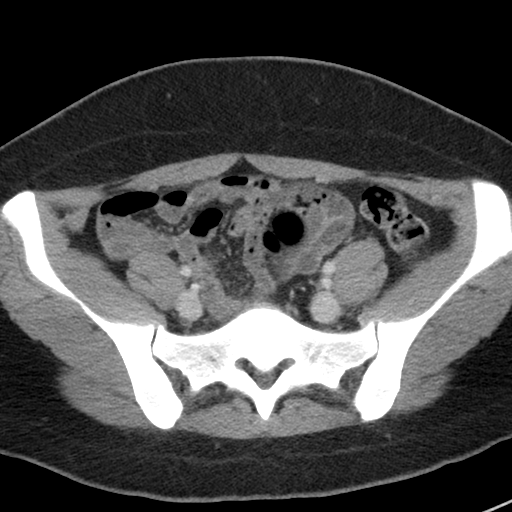
[im 35/83  soft-tissue]
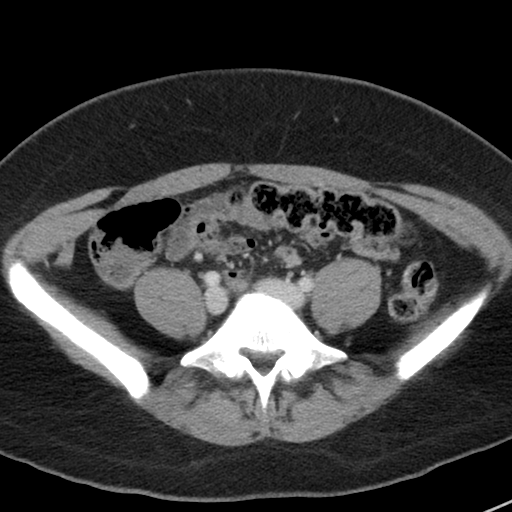
[im 42/83  soft-tissue]
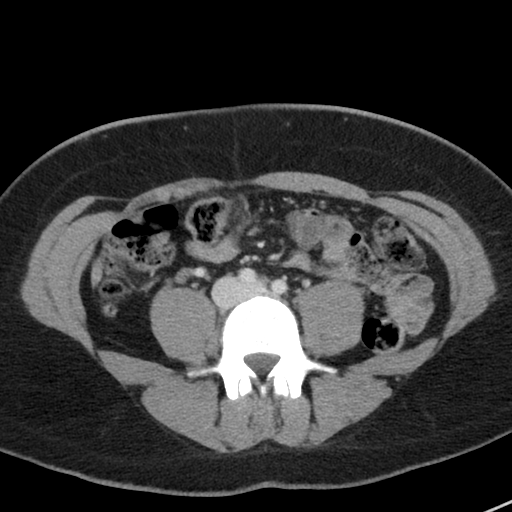
[im 48/83  soft-tissue]
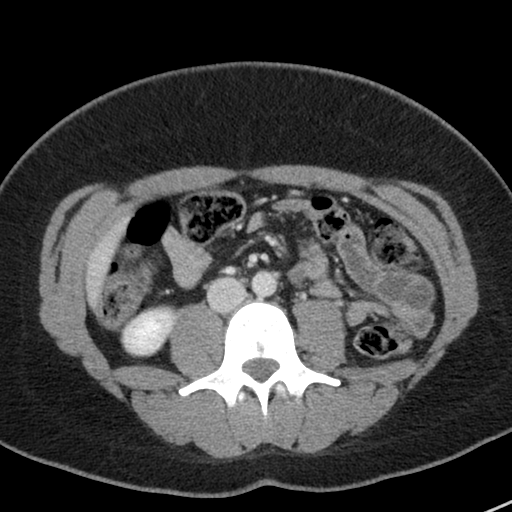
[im 54/83  soft-tissue]
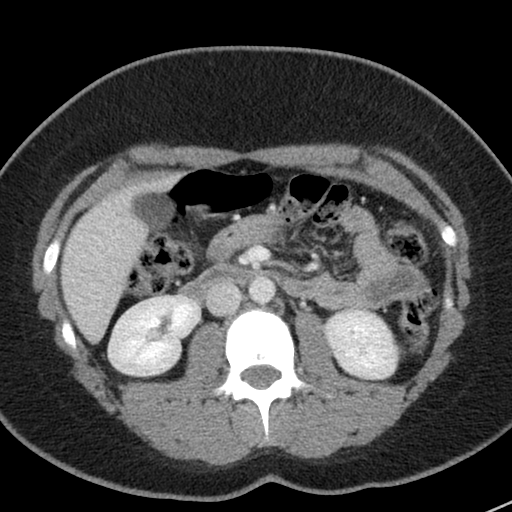
[im 54/83  bone]
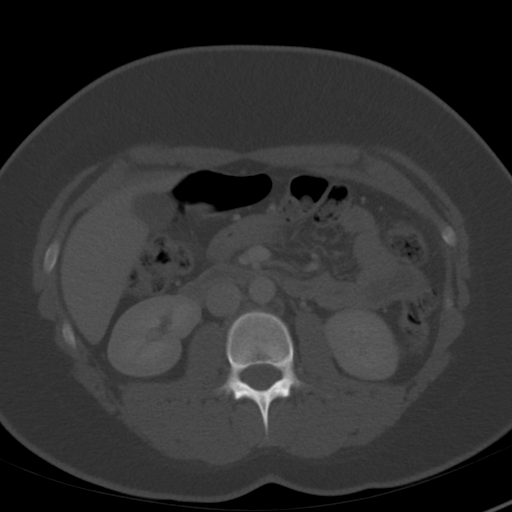
[im 60/83  soft-tissue]
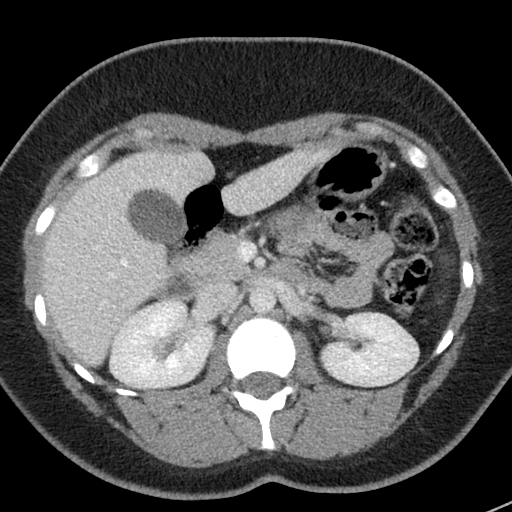
[im 67/83  soft-tissue]
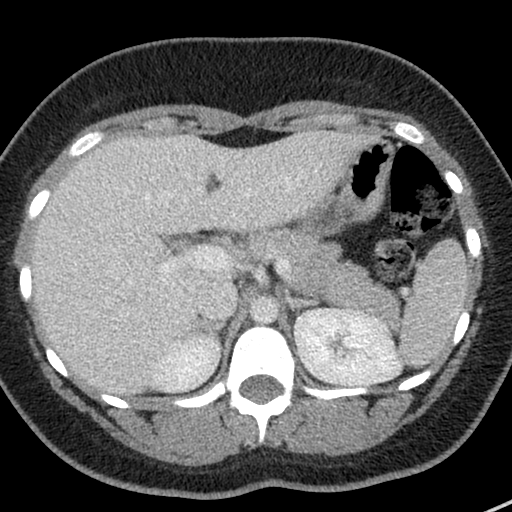
[im 73/83  soft-tissue]
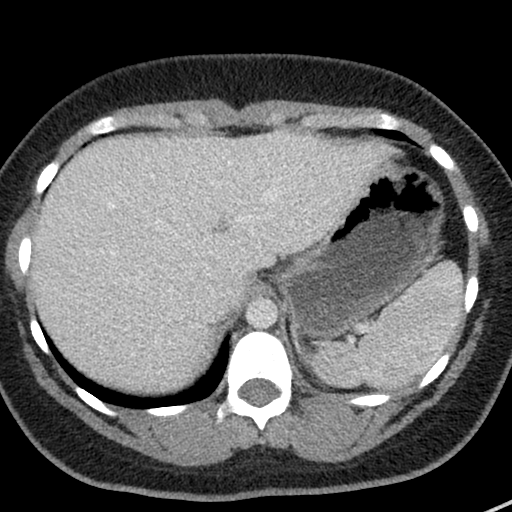
[im 79/83  soft-tissue]
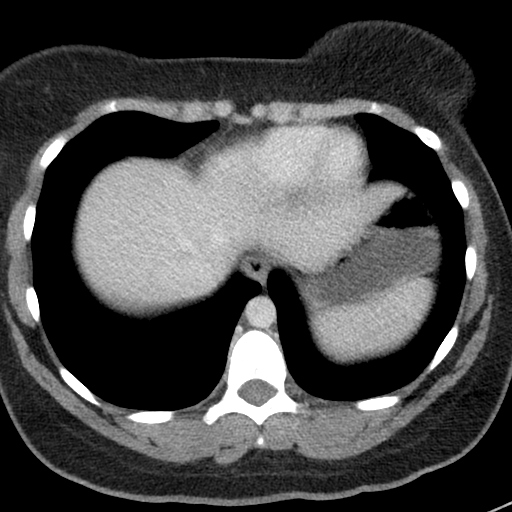

[Series 6: a/p w/ cor · coronal · 0.73mm/px · 3 of 97 slices shown]
[im 33/97  soft-tissue]
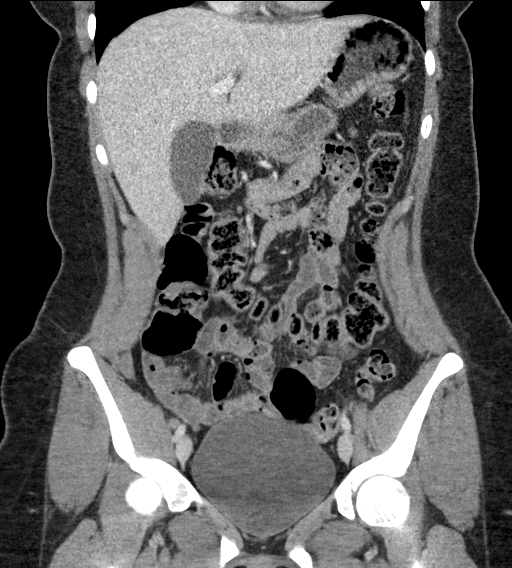
[im 43/97  soft-tissue]
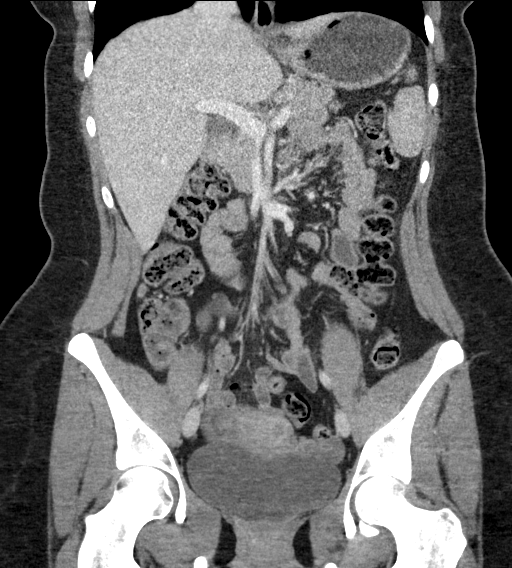
[im 54/97  soft-tissue]
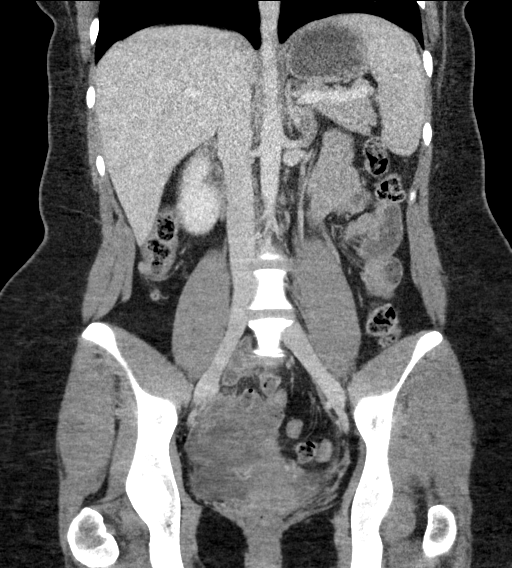

[16 of 46 positions shown; findings below may reference images not displayed]

FINDINGS: Lower chest: Visualized lung bases are clear.

Hepatobiliary: The liver demonstrates a normal contrast enhanced
appearance. Gallbladder within normal limits. No biliary dilatation.

Pancreas: Pancreas within normal limits.

Spleen: Spleen within normal limits.

Adrenals/Urinary Tract: Adrenal glands are normal. Kidneys equal
size with symmetric enhancement. No nephrolithiasis, hydronephrosis,
or focal enhancing renal mass. No hydroureter. Bladder within normal
limits.

Stomach/Bowel: Stomach within normal limits. No evidence for bowel
obstruction. Appendix well visualized within the right lower
quadrant, retrocecal in location. Appendix within normal limits for
caliber without associated inflammatory changes to suggest acute
appendicitis. No abnormal wall thickening, mucosal enhancement, or
inflammatory fat stranding seen about the bowels.

Vascular/Lymphatic: Normal intravascular enhancement seen throughout
the intra-abdominal aorta and its branch vessels. No pathologically
enlarged intra-abdominal or pelvic lymph nodes identified.

Reproductive: The uterus within normal limits. Left ovary
unremarkable. Large cysts measuring approximately 5.6 x 5.7 x 5.7 cm
seen within the right pelvic cul-de-sac, likely similar to previous
this is largely simple in appearance, with near. The adjacent right
ovary is not well delineated. Small volume free fluid within the
adjacent pelvic cul-de-sac.

Other: No free intraperitoneal air.

Musculoskeletal: No acute osseous abnormality. No worrisome lytic or
blastic osseous lesions.
IMPRESSION: 1. Normal appendix.  No findings to suggest acute appendicitis.
2. Prominent right adnexal cyst measuring up to 5.7 cm on today's
study, likely similar as compared to recent ultrasound from
06/29/2016. Small volume free fluid is evident within the pelvic
cul-de-sac, which may reflect interval cyst rupture, as no fluid was
visualized on prior ultrasound. If ovarian torsion is a concern, a
repeat Doppler ultrasound is recommended. Otherwise, follow-up
recommendations as detailed on prior exam.
3. No other acute intra-abdominal or pelvic process identified.

## 2019-05-13 ENCOUNTER — Ambulatory Visit (INDEPENDENT_AMBULATORY_CARE_PROVIDER_SITE_OTHER): Payer: Commercial Managed Care - PPO

## 2019-05-13 ENCOUNTER — Ambulatory Visit
Admission: EM | Admit: 2019-05-13 | Discharge: 2019-05-13 | Disposition: A | Discharge status: Home / Self Care | Payer: Commercial Managed Care - PPO | Attending: Physician Assistant | Admitting: Physician Assistant

## 2019-05-13 ENCOUNTER — Telehealth: Payer: Self-pay | Admitting: Emergency Medicine

## 2019-05-13 ENCOUNTER — Encounter: Payer: Self-pay | Admitting: Emergency Medicine

## 2019-05-13 ENCOUNTER — Other Ambulatory Visit: Payer: Self-pay

## 2019-05-13 DIAGNOSIS — M795 Residual foreign body in soft tissue: Secondary | ICD-10-CM | POA: Diagnosis not present

## 2019-05-13 DIAGNOSIS — S60450A Superficial foreign body of right index finger, initial encounter: Secondary | ICD-10-CM

## 2019-05-13 DIAGNOSIS — W273XXA Contact with needle (sewing), initial encounter: Secondary | ICD-10-CM | POA: Diagnosis not present

## 2019-05-13 MED ORDER — AMOXICILLIN-POT CLAVULANATE 875-125 MG PO TABS: 1.0000 | ORAL_TABLET | Freq: Two times a day (BID) | ORAL | 0 refills | Status: AC

## 2019-05-13 MED ORDER — TETANUS-DIPHTH-ACELL PERTUSSIS 5-2.5-18.5 LF-MCG/0.5 IM SUSP
0.5000 mL | Freq: Once | INTRAMUSCULAR | Status: AC
  Administered 2019-05-13: 13:00:00 0.5 mL via INTRAMUSCULAR

## 2019-05-13 NOTE — Telephone Encounter (Signed)
Patient called to be sure she was aware of scheduled appointment by Dr. Janee Morn at 2:30pm tomorrow.  Gave the address for the office as well.

## 2019-05-13 NOTE — Discharge Instructions (Addendum)
Tetanus updated. Start augmentin as directed. Dr Carollee Massed office will call you to set up appointment. Please follow up for removal. Monitor for swelling, spreading redness, purulent drainage, decreased ROM, follow up for reevaluation needed.

## 2019-05-13 NOTE — ED Provider Notes (Signed)
[facility]    CSN: [id number] Arrival date & time: 05/13/19  1132      History   Chief Complaint Chief Complaint  Patient presents with  . Hand Pain    HPI Kelsey Patel is a 24 y.o. female.   24 year old female comes in for possible foreign body in the right index finger. States had puncture wound to the index finger from sewing needle, and noticed the tip missing when removing the needle. Pain to the right index finger and worries for foreign body. Denies numbness/tingling. Denies radiation of pain. Denies swelling, erythema, warmth. Unknown last tetanus.      History reviewed. No pertinent past medical history.  Patient Active Problem List   Diagnosis Date Noted  . Dysmenorrhea 08/08/2016  . Ovarian cyst 08/08/2016    History reviewed. No pertinent surgical history.  OB History   No obstetric history on file.      Home Medications    Prior to Admission medications   Medication Sig Start Date End Date Taking? Authorizing Provider  amoxicillin-clavulanate (AUGMENTIN) 875-125 MG tablet Take 1 tablet by mouth every 12 (twelve) hours. 05/13/19   Tasia Catchings, Jahari Billy V, PA-C  BLISOVI 24 FE 1-20 MG-MCG(24) tablet TAKE 1 TABLET BY MOUTH EVERY DAY 08/18/18   Shelly Bombard, MD    Family History Family History  Problem Relation Age of Onset  . Healthy Mother   . Healthy Father     Social History Social History   Tobacco Use  . Smoking status: Never Smoker  . Smokeless tobacco: Never Used  Substance Use Topics  . Alcohol use: No  . Drug use: No     Allergies   Patient has no known allergies.   Review of Systems Review of Systems  Reason unable to perform ROS: See HPI as above.     Physical Exam Triage Vital Signs ED Triage Vitals  Enc Vitals Group     BP 05/13/19 1144 (!) 145/84     Pulse Rate 05/13/19 1144 75     Resp 05/13/19 1144 16     Temp 05/13/19 1144 98.2 F (36.8 C)     Temp Source 05/13/19 1144 Oral     SpO2 05/13/19 1144 99 %       Weight --      Height --      Head Circumference --      Peak Flow --      Pain Score 05/13/19 1155 3     Pain Loc --      Pain Edu? --      Excl. in [place]? --    No data found.  Updated Vital Signs BP (!) 145/84 (BP Location: Left Arm)   Pulse 75   Temp 98.2 F (36.8 C) (Oral)   Resp 16   SpO2 99%   Physical Exam Constitutional:      General: She is not in acute distress.    Appearance: Normal appearance. She is well-developed. She is not toxic-appearing or diaphoretic.  HENT:     Head: Normocephalic and atraumatic.  Eyes:     Conjunctiva/sclera: Conjunctivae normal.     Pupils: Pupils are equal, round, and reactive to light.  Pulmonary:     Effort: Pulmonary effort is normal. No respiratory distress.     Comments: Speaking in full sentences without difficulty Musculoskeletal:     Cervical back: Normal range of motion and neck supple.     Comments: Puncture wound to  to finger pad and lateral of right index finger. Bleeding controlled. No obvious foreign body felt, though tender to palpation of finger pad. Full ROM of finger. NVI  Skin:    General: Skin is warm and dry.  Neurological:     Mental Status: She is alert and oriented to person, place, and time.      UC Treatments / Results  Labs (all labs ordered are listed, but only abnormal results are displayed) Labs Reviewed - No data to display  EKG   Radiology DG Finger Index Right  Result Date: 05/13/2019 CLINICAL DATA:  24 year old who had a needle break off in the distal RIGHT index finger while working with a mid sewing machine earlier today. Initial encounter. EXAM: RIGHT INDEX FINGER 2+V COMPARISON:  None. FINDINGS: Metallic foreign body (needle tip) measuring approximately 5 mm is present in the volar soft tissues of the distal tuft. There is no underlying fracture or other osseous abnormality. IMPRESSION: Metallic foreign body (needle tip) in the volar soft tissues of the distal tuft. No osseous  abnormality. Electronically Signed   By: Hulan Saas M.D.   On: 05/13/2019 12:27    Procedures Procedures (including critical care time)  Medications Ordered in UC Medications  Tdap (BOOSTRIX) injection 0.5 mL (0.5 mLs Intramuscular Given 05/13/19 1306)    Initial Impression / Assessment and Plan / UC Course  I have reviewed the triage vital signs and the nursing notes.  Pertinent labs & imaging results that were available during my care of the patient were reviewed by me and considered in my medical decision making (see chart for details).     Xray shows residual needle to the right index finger.  However, was not able to locate needle from palpation, and therefore uncomfortable with foreign body removal.  Discussed case with on-call hand, Dr. Janee Morn, who will follow up with patient for foreign body removal.  Suggested starting antibiotics.  Patient will be started on Augmentin at this time.  Tetanus updated today.  Return precautions given.  Final Clinical Impressions(s) / UC Diagnoses   Final diagnoses:  Foreign body (FB) in soft tissue   ED Prescriptions    Medication Sig Dispense Auth. Provider   amoxicillin-clavulanate (AUGMENTIN) 875-125 MG tablet Take 1 tablet by mouth every 12 (twelve) hours. 14 tablet Belinda Fisher, PA-C     PDMP not reviewed this encounter.   Belinda Fisher, PA-C 05/13/19 1612

## 2019-05-13 NOTE — ED Notes (Signed)
Patient able to ambulate independently  

## 2019-05-13 NOTE — ED Triage Notes (Signed)
Pt presents to [facility] for assessment of a needle stick to the pointer finger of the right hand with a sewing needle from a machine.  Last tetanus unknown.  C/o continued pain, and concerned for foreign body retention.

## 2019-10-25 ENCOUNTER — Other Ambulatory Visit: Payer: Self-pay | Admitting: Obstetrics

## 2019-10-25 DIAGNOSIS — Z3041 Encounter for surveillance of contraceptive pills: Secondary | ICD-10-CM

## 2020-11-30 ENCOUNTER — Other Ambulatory Visit: Payer: Self-pay | Admitting: Obstetrics

## 2020-11-30 DIAGNOSIS — Z3041 Encounter for surveillance of contraceptive pills: Secondary | ICD-10-CM

## 2021-09-14 ENCOUNTER — Ambulatory Visit (INDEPENDENT_AMBULATORY_CARE_PROVIDER_SITE_OTHER): Payer: Commercial Managed Care - PPO | Admitting: Advanced Practice Midwife

## 2021-09-14 ENCOUNTER — Encounter: Payer: Self-pay | Admitting: Advanced Practice Midwife

## 2021-09-14 ENCOUNTER — Other Ambulatory Visit (HOSPITAL_COMMUNITY)
Admission: RE | Admit: 2021-09-14 | Discharge: 2021-09-14 | Disposition: A | Discharge status: Home / Self Care | Payer: Commercial Managed Care - PPO | Source: Ambulatory Visit | Attending: Advanced Practice Midwife | Admitting: Advanced Practice Midwife

## 2021-09-14 VITALS — BP 123/61 | HR 71 | Ht 67.0 in | Wt 198.0 lb

## 2021-09-14 DIAGNOSIS — Z124 Encounter for screening for malignant neoplasm of cervix: Secondary | ICD-10-CM | POA: Insufficient documentation

## 2021-09-14 DIAGNOSIS — N911 Secondary amenorrhea: Secondary | ICD-10-CM

## 2021-09-14 DIAGNOSIS — Z8742 Personal history of other diseases of the female genital tract: Secondary | ICD-10-CM

## 2021-09-14 DIAGNOSIS — Z01419 Encounter for gynecological examination (general) (routine) without abnormal findings: Secondary | ICD-10-CM

## 2021-09-14 NOTE — Progress Notes (Signed)
New GYN is in the office for annual. Reports last pap was about 5 years ago Reports hx of R ovarian cyst Currently on BC pill, recently has had some irregular cycles and missed a few months. LMP 09/10/21 Pt denies any abnormal symptoms.

## 2021-09-14 NOTE — Progress Notes (Signed)
   Subjective:     Kelsey Patel is a 26 y.o. female here at [facility] for a routine exam.  Current complaints: No menses for 4 months on low dose OCPs then light menses last week.  She has never been sexually active.  Personal health questionnaire reviewed: yes.  Do you have a primary care provider? no Do you feel safe at home? yes  Flowsheet Row Office Visit from 09/14/2021 in [facility]  PHQ-2 Total Score 0       Health Maintenance Due  Topic Date Due   COVID-19 Vaccine (1) Never done   HPV VACCINES (1 - 2-dose series) Never done   HIV Screening  Never done   Hepatitis C Screening  Never done   PAP-Cervical Cytology Screening  Never done   PAP SMEAR-Modifier  Never done     Risk factors for chronic health problems: Smoking: Alchohol/how much: Pt BMI: Body mass index is 31.01 kg/m.   Gynecologic History Patient's last menstrual period was 09/10/2021. Contraception: abstinence Last Pap: n/a.  Last mammogram: n/a.   Obstetric History OB History  Gravida Para Term Preterm AB Living  0 0 0 0 0 0  SAB IAB Ectopic Multiple Live Births  0 0 0 0 0     The following portions of the patient's history were reviewed and updated as appropriate: allergies, current medications, past family history, past medical history, past social history, past surgical history, and problem list.  Review of Systems Pertinent items noted in HPI and remainder of comprehensive ROS otherwise negative.    Objective:   BP 123/61   Pulse 71   Ht 5\' 7"  (1.702 m)   Wt 198 lb (89.8 kg)   LMP 09/10/2021   BMI 31.01 kg/m  VS reviewed, nursing note reviewed,  Constitutional: well developed, well nourished, no distress HEENT: normocephalic CV: normal rate Pulm/chest wall: normal effort Breast Exam:  Deferred with low risks and shared decision making, discussed recommendation to start mammogram between 40-50 yo/  Abdomen: soft Neuro: alert and oriented x 3 Skin:  warm, dry Psych: affect normal Pelvic exam: Performed: Cervix pink, visually closed, without lesion, scant white creamy discharge, vaginal walls and external genitalia normal Bimanual exam: Cervix 0/long/high, firm, anterior, neg CMT, uterus nontender, nonenlarged, adnexa without tenderness, enlargement, or mass     Assessment/Plan:   1. Screening for cervical cancer  - Cytology - PAP( [place])  2. Well woman exam with routine gynecological exam  3. Secondary amenorrhea --Likely related to OCPs.  Pt with regular menses prior to OCP use.   --Discussed options. Pt not sexually active. --Plan to stop OCPs after her current pack.  --F/U in office in 3 months --If menses resume, can discuss if she desires OCPs and switch brands and if still amenorrheic or oligomenorrheic, will do work-up, labs, etc.   4. History of ovarian cyst --5 cm simple cyst, presented to ED in 2018 with pain and cyst dx by ultrasound.  F/U ultrasounds with smaller simple cysts, then resolution of cysts on later ultrasound. --Pt started on OCPs to prevent ovarian cysts --Exam wnl today, pt without pain or other signs of ovarian cyst --Will f/u in 3 months without OCPs and discuss whether pt wants to continue treatment    Return in about 3 months (around 12/15/2021) for Gyn follow up for Abnormal Uterine Bleeding, with me if possible.   12/17/2021, CNM 4:37 PM

## 2021-09-17 LAB — CYTOLOGY - PAP
Diagnosis: NEGATIVE
Diagnosis: REACTIVE

## 2021-09-28 ENCOUNTER — Encounter: Payer: Commercial Managed Care - PPO | Admitting: Obstetrics

## 2021-12-20 ENCOUNTER — Encounter: Payer: Self-pay | Admitting: Advanced Practice Midwife

## 2021-12-20 ENCOUNTER — Ambulatory Visit (INDEPENDENT_AMBULATORY_CARE_PROVIDER_SITE_OTHER): Payer: Commercial Managed Care - PPO | Admitting: Advanced Practice Midwife

## 2021-12-20 VITALS — BP 124/68 | HR 67 | Ht 66.0 in | Wt 203.0 lb

## 2021-12-20 DIAGNOSIS — Z8742 Personal history of other diseases of the female genital tract: Secondary | ICD-10-CM

## 2021-12-20 DIAGNOSIS — N911 Secondary amenorrhea: Secondary | ICD-10-CM

## 2021-12-20 NOTE — Progress Notes (Signed)
26 y.o GYN presents for FU.

## 2021-12-20 NOTE — Progress Notes (Signed)
   GYNECOLOGY PROGRESS NOTE  History:  26 y.o. G0P0000 presents to [facility] office today for a follow up gyn visit. She was having irregular menses on OCPs with hx of regular periods before contraception and wanted to do a trial without OCPs.  She is not sexually active and started OCPs initially after dx of ovarian cyst.  She denies any abdominal or pelvic pain.  She has been off OCPs x 3 months and has had 2 regular periods, ~ 28 days apart. She is happy with this and does not want to resume OCPs.  She denies h/a, dizziness, shortness of breath, n/v, or fever/chills.    The following portions of the patient's history were reviewed and updated as appropriate: allergies, current medications, past family history, past medical history, past social history, past surgical history and problem list. Last pap smear on 09/14/21 was normal.  Health Maintenance Due  Topic Date Due   COVID-19 Vaccine (1) Never done   HPV VACCINES (1 - 2-dose series) Never done   HIV Screening  Never done   Hepatitis C Screening  Never done   INFLUENZA VACCINE  11/23/2021     Review of Systems:  Pertinent items are noted in HPI.   Objective:  Physical Exam Blood pressure 124/68, pulse 67, height 5\' 6"  (1.676 m), weight 203 lb (92.1 kg), last menstrual period 11/27/2021. VS reviewed, nursing note reviewed,  Constitutional: well developed, well nourished, no distress HEENT: normocephalic CV: normal rate Pulm/chest wall: normal effort Breast Exam: deferred Abdomen: soft Neuro: alert and oriented x 3 Skin: warm, dry Psych: affect normal Pelvic exam: deferred  Assessment & Plan:  1. Secondary amenorrhea --R/T OCPs, periods regular without OCPs  2. History of ovarian cyst --No s/sx of cyst, if cysts return, will discuss tx at that time but pt to remain off OCPs as prevention at this time.   Return if symptoms worsen or fail to improve.   01/27/2022, CNM 5:13 PM

## 2022-01-29 ENCOUNTER — Other Ambulatory Visit: Payer: Self-pay | Admitting: Obstetrics

## 2022-01-29 DIAGNOSIS — Z3041 Encounter for surveillance of contraceptive pills: Secondary | ICD-10-CM

## 2024-04-25 ENCOUNTER — Encounter: Payer: Self-pay | Admitting: Emergency Medicine

## 2024-04-25 ENCOUNTER — Ambulatory Visit
Admission: EM | Admit: 2024-04-25 | Discharge: 2024-04-25 | Disposition: A | Discharge status: Home / Self Care | Source: Home / Self Care

## 2024-04-25 DIAGNOSIS — J029 Acute pharyngitis, unspecified: Secondary | ICD-10-CM

## 2024-04-25 LAB — POCT RAPID STREP A (OFFICE): Rapid Strep A Screen: NEGATIVE

## 2024-04-25 NOTE — ED Provider Notes (Signed)
 " [facility]    CSN: [id number] Arrival date & time: 04/25/24  1248      History   Chief Complaint Chief Complaint  Patient presents with   Sore Throat    HPI Kelsey Patel is a 29 y.o. female.   Pt presents today due to throat pain for the past 4 days. Pt states that she has been using OTC meds (ibuprofen, dayquil, nyquil) with no significant relief. Pt states that she is eating and drinking, pt states that it is painful to swallow. Pt denies known sick contacts.   The history is provided by the patient.  Sore Throat    Past Medical History:  Diagnosis Date   Right ovarian cyst     Patient Active Problem List   Diagnosis Date Noted   Dysmenorrhea 08/08/2016   Ovarian cyst 08/08/2016    Past Surgical History:  Procedure Laterality Date   WISDOM TOOTH EXTRACTION  01/2015    OB History     Gravida  0   Para  0   Term  0   Preterm  0   AB  0   Living  0      SAB  0   IAB  0   Ectopic  0   Multiple  0   Live Births  0            Home Medications    Prior to Admission medications  Medication Sig Start Date End Date Taking? Authorizing Provider  amoxicillin -clavulanate (AUGMENTIN ) 875-125 MG tablet Take 1 tablet by mouth every 12 (twelve) hours. Patient not taking: Reported on 09/14/2021 05/13/19   Babara Greig GAILS, PA-C  HAILEY 24 FE 1-20 MG-MCG(24) tablet TAKE 1 TABLET BY MOUTH EVERY DAY 01/31/22   Rudy Carlin LABOR, MD    Family History Family History  Problem Relation Age of Onset   Healthy Mother    Healthy Father     Social History Social History[1]   Allergies   Patient has no known allergies.   Review of Systems Review of Systems   Physical Exam Triage Vital Signs ED Triage Vitals  Encounter Vitals Group     BP 04/25/24 1321 125/73     Girls Systolic BP Percentile --      Girls Diastolic BP Percentile --      Boys Systolic BP Percentile --      Boys Diastolic BP Percentile --      Pulse Rate 04/25/24 1321  88     Resp 04/25/24 1321 18     Temp 04/25/24 1321 99.2 F (37.3 C)     Temp Source 04/25/24 1321 Oral     SpO2 04/25/24 1321 96 %     Weight 04/25/24 1330 215 lb (97.5 kg)     Height 04/25/24 1330 5' 6 (1.676 m)     Head Circumference --      Peak Flow --      Pain Score 04/25/24 1330 7     Pain Loc --      Pain Education --      Exclude from Growth Chart --    No data found.  Updated Vital Signs BP 125/73 (BP Location: Right Arm)   Pulse 88   Temp 99.2 F (37.3 C) (Oral)   Resp 18   Ht 5' 6 (1.676 m)   Wt 215 lb (97.5 kg)   LMP 04/24/2024 (Exact Date)   SpO2 96%   BMI 34.70 kg/m  Visual Acuity Right Eye Distance:   Left Eye Distance:   Bilateral Distance:    Right Eye Near:   Left Eye Near:    Bilateral Near:     Physical Exam Vitals and nursing note reviewed.  Constitutional:      General: She is not in acute distress.    Appearance: Normal appearance. She is not ill-appearing, toxic-appearing or diaphoretic.  HENT:     Mouth/Throat:     Mouth: Mucous membranes are moist.     Pharynx: Oropharynx is clear. No oropharyngeal exudate or posterior oropharyngeal erythema.  Eyes:     General: No scleral icterus. Cardiovascular:     Rate and Rhythm: Normal rate and regular rhythm.     Heart sounds: Normal heart sounds.  Pulmonary:     Effort: Pulmonary effort is normal. No respiratory distress.     Breath sounds: Normal breath sounds. No wheezing or rhonchi.  Musculoskeletal:     Cervical back: No tenderness.  Lymphadenopathy:     Cervical: No cervical adenopathy.  Skin:    General: Skin is warm.  Neurological:     Mental Status: She is alert and oriented to person, place, and time.  Psychiatric:        Mood and Affect: Mood normal.        Behavior: Behavior normal.      UC Treatments / Results  Labs (all labs ordered are listed, but only abnormal results are displayed) Labs Reviewed  POCT RAPID STREP A (OFFICE) - Normal     EKG   Radiology No results found.  Procedures Procedures (including critical care time)  Medications Ordered in UC Medications - No data to display  Initial Impression / Assessment and Plan / UC Course  I have reviewed the triage vital signs and the nursing notes.  Pertinent labs & imaging results that were available during my care of the patient were reviewed by me and considered in my medical decision making (see chart for details).     Final Clinical Impressions(s) / UC Diagnoses   Final diagnoses:  Acute pharyngitis, unspecified etiology     Discharge Instructions       You may use ibuprofen and/or Tylenol  as needed for pain and fever.  Chloraseptic and Cepacol make a numbing throat lozenges that can be obtained over-the-counter that is helpful for throat pain as well. You may also use warm salt water gargles for symptoms.     ED Prescriptions   None    PDMP not reviewed this encounter.    [1]  Social History Tobacco Use   Smoking status: Never   Smokeless tobacco: Never  Vaping Use   Vaping status: Never Used  Substance Use Topics   Alcohol use: No   Drug use: No     Andra Corean BROCKS, PA-C 04/25/24 1410  "

## 2024-04-25 NOTE — ED Triage Notes (Signed)
 Pt c/o sore throat x's 4 days   St's just isn't getting any better

## 2024-04-25 NOTE — Discharge Instructions (Signed)
"   You may use ibuprofen and/or Tylenol  as needed for pain and fever.  Chloraseptic and Cepacol make a numbing throat lozenges that can be obtained over-the-counter that is helpful for throat pain as well. You may also use warm salt water gargles for symptoms.  "

## 2024-07-08 ENCOUNTER — Ambulatory Visit: Admitting: Family Medicine

## 2024-09-03 ENCOUNTER — Ambulatory Visit: Admitting: Family Medicine
